# Patient Record
Sex: Female | Born: 1945 | Race: Black or African American | Hispanic: No | Marital: Married
Health system: Southern US, Community
[De-identification: ages and names within clinical notes are randomized; demographics above are authoritative.]

## PROBLEM LIST (undated history)

## (undated) DIAGNOSIS — E119 Type 2 diabetes mellitus without complications: Secondary | ICD-10-CM

## (undated) DIAGNOSIS — Z860101 Personal history of adenomatous and serrated colon polyps: Secondary | ICD-10-CM

## (undated) DIAGNOSIS — E7800 Pure hypercholesterolemia, unspecified: Secondary | ICD-10-CM

## (undated) DIAGNOSIS — K641 Second degree hemorrhoids: Secondary | ICD-10-CM

## (undated) DIAGNOSIS — K219 Gastro-esophageal reflux disease without esophagitis: Secondary | ICD-10-CM

## (undated) DIAGNOSIS — I10 Essential (primary) hypertension: Secondary | ICD-10-CM

## (undated) DIAGNOSIS — Z9109 Other allergy status, other than to drugs and biological substances: Secondary | ICD-10-CM

## (undated) HISTORY — PX: COLONOSCOPY: SHX174

## (undated) HISTORY — PX: TUBAL LIGATION: SHX77

---

## 2004-11-27 ENCOUNTER — Ambulatory Visit: Payer: Self-pay

## 2005-07-15 ENCOUNTER — Emergency Department: Payer: Self-pay | Admitting: Emergency Medicine

## 2005-07-15 ENCOUNTER — Other Ambulatory Visit: Payer: Self-pay

## 2006-04-25 ENCOUNTER — Ambulatory Visit: Payer: Self-pay

## 2007-05-21 ENCOUNTER — Ambulatory Visit: Payer: Self-pay

## 2008-08-31 ENCOUNTER — Ambulatory Visit: Payer: Self-pay | Admitting: Unknown Physician Specialty

## 2008-09-25 ENCOUNTER — Observation Stay: Payer: Self-pay | Admitting: Internal Medicine

## 2010-08-21 ENCOUNTER — Ambulatory Visit: Payer: Self-pay

## 2013-05-12 ENCOUNTER — Ambulatory Visit: Payer: Self-pay | Admitting: Internal Medicine

## 2014-07-14 ENCOUNTER — Ambulatory Visit: Payer: Self-pay | Admitting: Internal Medicine

## 2017-06-20 ENCOUNTER — Other Ambulatory Visit: Payer: Self-pay | Admitting: Internal Medicine

## 2017-06-20 DIAGNOSIS — Z1231 Encounter for screening mammogram for malignant neoplasm of breast: Secondary | ICD-10-CM

## 2017-07-16 ENCOUNTER — Ambulatory Visit
Admission: RE | Admit: 2017-07-16 | Discharge: 2017-07-16 | Disposition: A | Discharge status: Home / Self Care | Payer: Medicare HMO | Source: Ambulatory Visit | Attending: Internal Medicine | Admitting: Internal Medicine

## 2017-07-16 DIAGNOSIS — Z1231 Encounter for screening mammogram for malignant neoplasm of breast: Secondary | ICD-10-CM | POA: Insufficient documentation

## 2019-09-02 ENCOUNTER — Other Ambulatory Visit: Payer: Self-pay | Admitting: Internal Medicine

## 2019-09-02 DIAGNOSIS — Z1231 Encounter for screening mammogram for malignant neoplasm of breast: Secondary | ICD-10-CM

## 2019-10-07 ENCOUNTER — Ambulatory Visit
Admission: RE | Admit: 2019-10-07 | Discharge: 2019-10-07 | Disposition: A | Discharge status: Home / Self Care | Payer: Medicare HMO | Source: Ambulatory Visit | Attending: Internal Medicine | Admitting: Internal Medicine

## 2019-10-07 DIAGNOSIS — Z1231 Encounter for screening mammogram for malignant neoplasm of breast: Secondary | ICD-10-CM | POA: Diagnosis present

## 2020-12-11 ENCOUNTER — Other Ambulatory Visit: Payer: Self-pay | Admitting: Internal Medicine

## 2020-12-11 DIAGNOSIS — Z1231 Encounter for screening mammogram for malignant neoplasm of breast: Secondary | ICD-10-CM

## 2021-01-05 ENCOUNTER — Other Ambulatory Visit: Payer: Self-pay

## 2021-01-05 ENCOUNTER — Ambulatory Visit
Admission: RE | Admit: 2021-01-05 | Discharge: 2021-01-05 | Disposition: A | Discharge status: Home / Self Care | Payer: Medicare HMO | Source: Ambulatory Visit | Attending: Internal Medicine | Admitting: Internal Medicine

## 2021-01-05 DIAGNOSIS — Z1231 Encounter for screening mammogram for malignant neoplasm of breast: Secondary | ICD-10-CM | POA: Diagnosis present

## 2022-01-18 ENCOUNTER — Other Ambulatory Visit: Payer: Self-pay | Admitting: Internal Medicine

## 2022-01-18 DIAGNOSIS — Z1231 Encounter for screening mammogram for malignant neoplasm of breast: Secondary | ICD-10-CM

## 2022-01-29 ENCOUNTER — Emergency Department
Admission: EM | Admit: 2022-01-29 | Discharge: 2022-01-30 | Disposition: A | Discharge status: Home / Self Care | Payer: Medicare HMO | Attending: Emergency Medicine | Admitting: Emergency Medicine

## 2022-01-29 ENCOUNTER — Other Ambulatory Visit: Payer: Self-pay

## 2022-01-29 ENCOUNTER — Emergency Department: Payer: Medicare HMO

## 2022-01-29 ENCOUNTER — Other Ambulatory Visit
Admission: RE | Admit: 2022-01-29 | Discharge: 2022-01-29 | Disposition: A | Discharge status: Home / Self Care | Payer: Medicare HMO | Source: Ambulatory Visit | Attending: *Deleted | Admitting: *Deleted

## 2022-01-29 DIAGNOSIS — R079 Chest pain, unspecified: Secondary | ICD-10-CM | POA: Insufficient documentation

## 2022-01-29 DIAGNOSIS — I10 Essential (primary) hypertension: Secondary | ICD-10-CM | POA: Diagnosis not present

## 2022-01-29 DIAGNOSIS — R0789 Other chest pain: Secondary | ICD-10-CM | POA: Diagnosis present

## 2022-01-29 DIAGNOSIS — K2100 Gastro-esophageal reflux disease with esophagitis, without bleeding: Secondary | ICD-10-CM | POA: Diagnosis not present

## 2022-01-29 DIAGNOSIS — E119 Type 2 diabetes mellitus without complications: Secondary | ICD-10-CM | POA: Insufficient documentation

## 2022-01-29 DIAGNOSIS — J019 Acute sinusitis, unspecified: Secondary | ICD-10-CM | POA: Insufficient documentation

## 2022-01-29 DIAGNOSIS — R051 Acute cough: Secondary | ICD-10-CM | POA: Diagnosis not present

## 2022-01-29 HISTORY — DX: Type 2 diabetes mellitus without complications: E11.9

## 2022-01-29 HISTORY — DX: Pure hypercholesterolemia, unspecified: E78.00

## 2022-01-29 HISTORY — DX: Essential (primary) hypertension: I10

## 2022-01-29 LAB — CBC
HCT: 40.8 % (ref 36.0–46.0)
Hemoglobin: 13.3 g/dL (ref 12.0–15.0)
MCH: 28.3 pg (ref 26.0–34.0)
MCHC: 32.6 g/dL (ref 30.0–36.0)
MCV: 86.8 fL (ref 80.0–100.0)
Platelets: 334 K/uL (ref 150–400)
RBC: 4.7 MIL/uL (ref 3.87–5.11)
RDW: 14.7 % (ref 11.5–15.5)
WBC: 6.5 K/uL (ref 4.0–10.5)
nRBC: 0 % (ref 0.0–0.2)

## 2022-01-29 LAB — TROPONIN I (HIGH SENSITIVITY)
Troponin I (High Sensitivity): 6 ng/L (ref <18)
Troponin I (High Sensitivity): 6 ng/L (ref <18)
Troponin I (High Sensitivity): 6 ng/L (ref <18)

## 2022-01-29 LAB — BASIC METABOLIC PANEL WITH GFR
Anion gap: 13 (ref 5–15)
BUN: 18 mg/dL (ref 8–23)
CO2: 28 mmol/L (ref 22–32)
Calcium: 10.4 mg/dL — ABNORMAL HIGH (ref 8.9–10.3)
Chloride: 100 mmol/L (ref 98–111)
Creatinine, Ser: 0.88 mg/dL (ref 0.44–1.00)
GFR, Estimated: >60 mL/min (ref >60)
Glucose, Bld: 94 mg/dL (ref 70–99)
Potassium: 3.9 mmol/L (ref 3.5–5.1)
Sodium: 141 mmol/L (ref 135–145)

## 2022-01-29 NOTE — ED Triage Notes (Signed)
Pt comes with c/o CP that is left sided that started last night. Pt states hx o indigestion. Pt states she went to PCP today and was brought over here. BP was elevated and changed seen in EKG.  Pt denies any SOB.

## 2022-01-29 NOTE — ED Provider Notes (Signed)
[facility] Provider Note    Event Date/Time   First MD Initiated Contact with Patient 01/29/22 2356     (approximate)   History   Chief Complaint Chest Pain   HPI  Julia Novak is a 76 y.o. female with past medical history of hypertension, hyperlipidemia, diabetes who presents to the ED complaining of chest pain.  Patient reports that she has been dealing with intermittent pressure in the center of her chest for the past 24 hours.  She states it is associated with belching and a feeling of acid in the back of her throat.  She denies any fevers, cough, or difficulty breathing.  She additionally complains of pressure in the area of her frontal and maxillary sinuses.  She states she has been dealing with significant postnasal drip but denies any purulent nasal drainage.  She has not had any fevers, states she had partial relief of symptoms with Claritin.     Physical Exam   Triage Vital Signs: ED Triage Vitals  Enc Vitals Group     BP 01/29/22 1517 (!) 170/87     Pulse Rate 01/29/22 1517 (!) 101     Resp 01/29/22 1517 17     Temp 01/29/22 1517 98 F (36.7 C)     Temp src --      SpO2 01/29/22 1517 100 %     Weight --      Height --      Head Circumference --      Peak Flow --      Pain Score 01/29/22 1518 4     Pain Loc --      Pain Edu? --      Excl. in GC? --     Most recent vital signs: Vitals:   01/29/22 1517 01/29/22 2355  BP: (!) 170/87 (!) 180/84  Pulse: (!) 101 90  Resp: 17 16  Temp: 98 F (36.7 C)   SpO2: 100% 98%    Constitutional: Alert and oriented. Eyes: Conjunctivae are normal. Head: Atraumatic. Nose: No congestion/rhinnorhea. Mouth/Throat: Mucous membranes are moist.  Cardiovascular: Normal rate, regular rhythm. Grossly normal heart sounds.  2+ radial pulses bilaterally. Respiratory: Normal respiratory effort.  No retractions. Lungs CTAB. Gastrointestinal: Soft and nontender. No distention. Musculoskeletal: No lower  extremity tenderness nor edema.  Neurologic:  Normal speech and language. No gross focal neurologic deficits are appreciated.    ED Results / Procedures / Treatments   Labs (all labs ordered are listed, but only abnormal results are displayed) Labs Reviewed  BASIC METABOLIC PANEL - Abnormal; Notable for the following components:      Result Value   Calcium 10.4 (*)    All other components within normal limits  CBC  TROPONIN I (HIGH SENSITIVITY)  TROPONIN I (HIGH SENSITIVITY)     EKG  ED ECG REPORT I, Chesley Noon, the attending physician, personally viewed and interpreted this ECG.   Date: 01/29/2022  EKG Time: 15:17  Rate: 105  Rhythm: sinus tachycardia  Axis: Normal  Intervals:none  ST&T Change: None  RADIOLOGY Chest x-ray reviewed and interpreted by me with interstitial changes noted, no focal infiltrates or effusions.  PROCEDURES:  Critical Care performed: No  Procedures   MEDICATIONS ORDERED IN ED: Medications - No data to display   IMPRESSION / MDM / ASSESSMENT AND PLAN / ED COURSE  I reviewed the triage vital signs and the nursing notes.  Patient's presentation is most consistent with acute complicated illness / injury requiring diagnostic workup.  76 y.o. female with past medical history of hypertension, hyperlipidemia Differential diagnosis includes, but is not limited to, ACS, PE, pneumonia, pneumothorax, sinusitis, GERD, musculoskeletal pain.  Patient well-appearing and in no acute distress, vital signs remarkable for elevated blood pressure but otherwise reassuring.  EKG shows no evidence of arrhythmia or ischemia and 2 sets of troponin are negative.  Given patient's atypical symptoms, low suspicion for ACS and chest discomfort seems more consistent with GERD.  Chest x-ray shows no signs of pneumonia, does show possible interstitial disease that patient was informed of and will follow up with her PCP for.  No symptoms  to suggest pneumonia at this time.  Remainder of labs are reassuring with CBC showing no anemia or leukocytosis, BMP without electrolyte abnormality or AKI.  Patient is appropriate for discharge home with PCP follow-up, counseled on symptomatic management for GERD and sinusitis.  Patient agrees with plan.      FINAL CLINICAL IMPRESSION(S) / ED DIAGNOSES   Final diagnoses:  Atypical chest pain  Acute non-recurrent sinusitis, unspecified location  Gastroesophageal reflux disease with esophagitis without hemorrhage     Rx / DC Orders   ED Discharge Orders          Ordered    guaiFENesin (MUCINEX) 600 MG 12 hr tablet  2 times daily        01/30/22 0014             Note:  This document was prepared using Dragon voice recognition software and may include unintentional dictation errors.   Chesley Noon, MD 01/30/22 [phone]

## 2022-01-30 MED ORDER — GUAIFENESIN ER 600 MG PO TB12: 600.0000 mg | ORAL_TABLET | Freq: Two times a day (BID) | ORAL | 0 refills | Status: AC

## 2022-02-19 ENCOUNTER — Ambulatory Visit
Admission: RE | Admit: 2022-02-19 | Discharge: 2022-02-19 | Disposition: A | Discharge status: Home / Self Care | Payer: Medicare HMO | Source: Ambulatory Visit | Attending: Internal Medicine | Admitting: Internal Medicine

## 2022-02-19 DIAGNOSIS — Z1231 Encounter for screening mammogram for malignant neoplasm of breast: Secondary | ICD-10-CM | POA: Insufficient documentation

## 2023-03-10 ENCOUNTER — Ambulatory Visit: Payer: Medicare HMO

## 2023-03-10 DIAGNOSIS — K640 First degree hemorrhoids: Secondary | ICD-10-CM | POA: Diagnosis not present

## 2023-03-10 DIAGNOSIS — D122 Benign neoplasm of ascending colon: Secondary | ICD-10-CM

## 2023-03-10 DIAGNOSIS — D120 Benign neoplasm of cecum: Secondary | ICD-10-CM

## 2023-03-10 DIAGNOSIS — Z86010 Personal history of colonic polyps: Secondary | ICD-10-CM

## 2023-03-10 DIAGNOSIS — Z09 Encounter for follow-up examination after completed treatment for conditions other than malignant neoplasm: Secondary | ICD-10-CM | POA: Diagnosis not present

## 2023-04-01 ENCOUNTER — Other Ambulatory Visit: Payer: Self-pay | Admitting: Internal Medicine

## 2023-04-01 DIAGNOSIS — Z1231 Encounter for screening mammogram for malignant neoplasm of breast: Secondary | ICD-10-CM

## 2023-04-30 ENCOUNTER — Ambulatory Visit
Admission: RE | Admit: 2023-04-30 | Discharge: 2023-04-30 | Disposition: A | Discharge status: Home / Self Care | Payer: Medicare HMO | Source: Ambulatory Visit | Attending: Internal Medicine | Admitting: Internal Medicine

## 2023-04-30 DIAGNOSIS — Z1231 Encounter for screening mammogram for malignant neoplasm of breast: Secondary | ICD-10-CM | POA: Diagnosis present

## 2023-05-02 IMAGING — MG MM DIGITAL SCREENING BILAT W/ TOMO AND CAD
6 of 10 series · 6 of 30 positions shown · non-contrast
Comparison: Previous exam(s).

CLINICAL DATA: Screening.

EXAM:
DIGITAL SCREENING BILATERAL MAMMOGRAM WITH TOMOSYNTHESIS AND CAD
TECHNIQUE: Bilateral screening digital craniocaudal and mediolateral oblique
mammograms were obtained. Bilateral screening digital breast
tomosynthesis was performed. The images were evaluated with
computer-aided detection.

[R MLO synth-2D (1 of 2)]
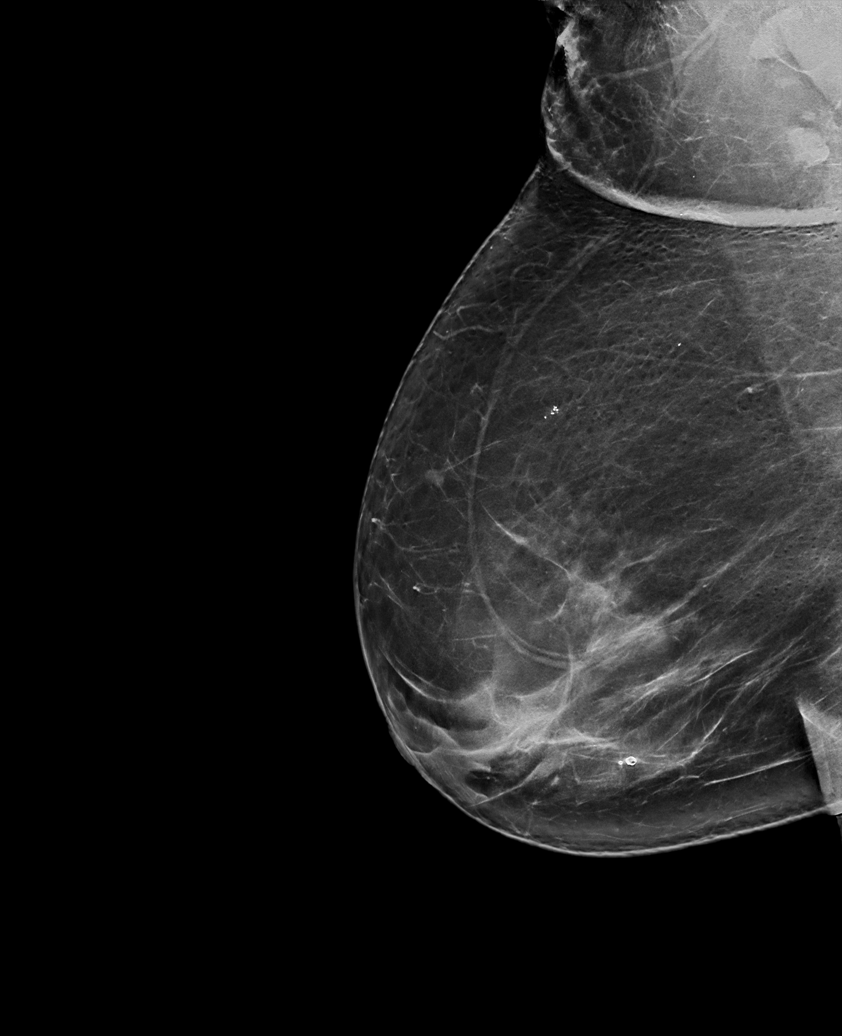

[L CC synth-2D]
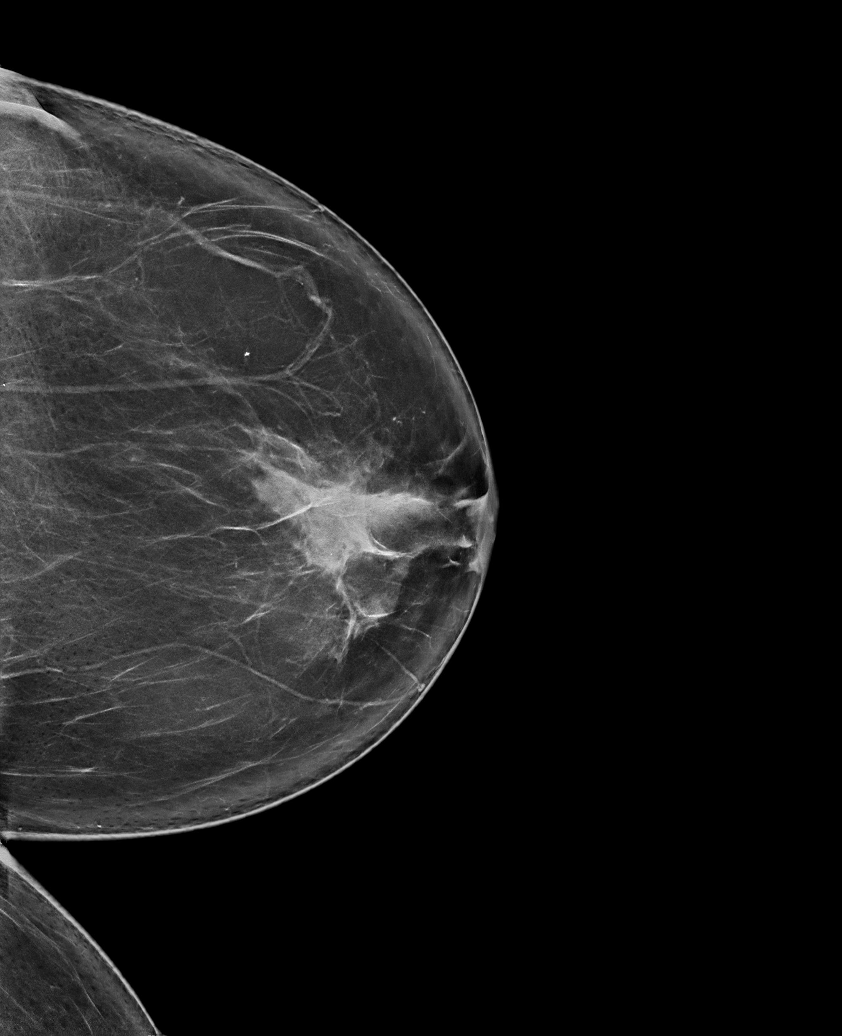

[R MLO synth-2D (2 of 2)]
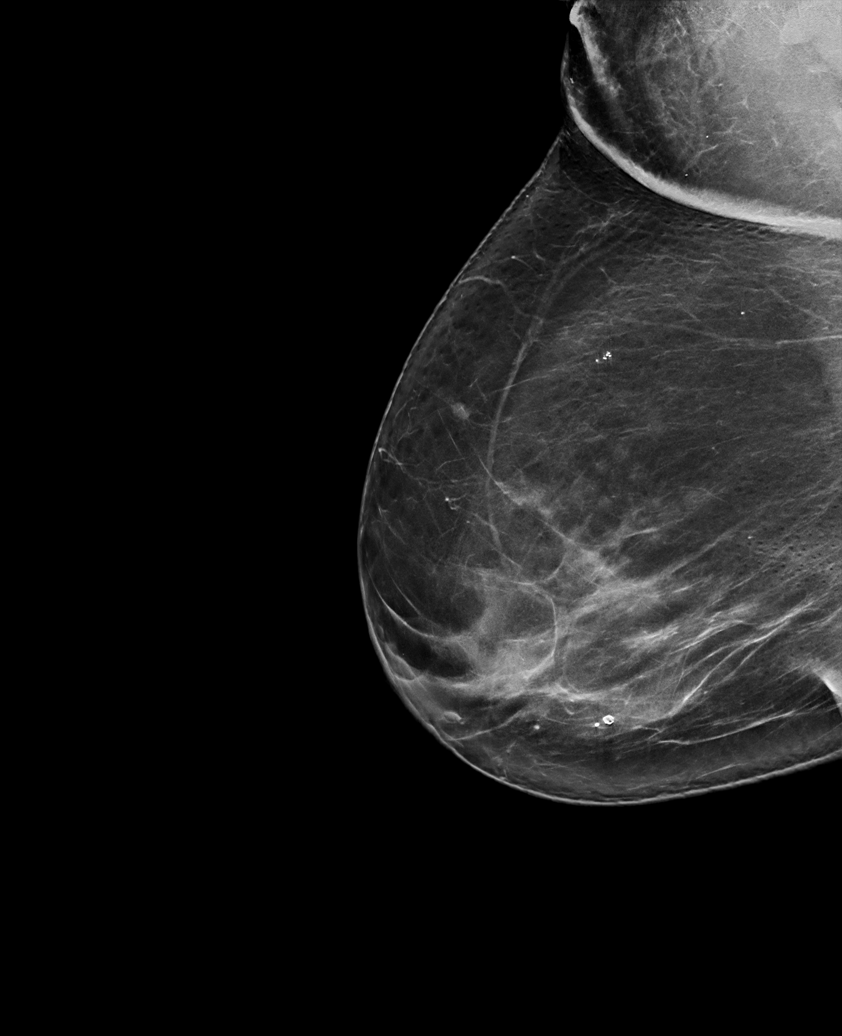

[L MLO synth-2D]
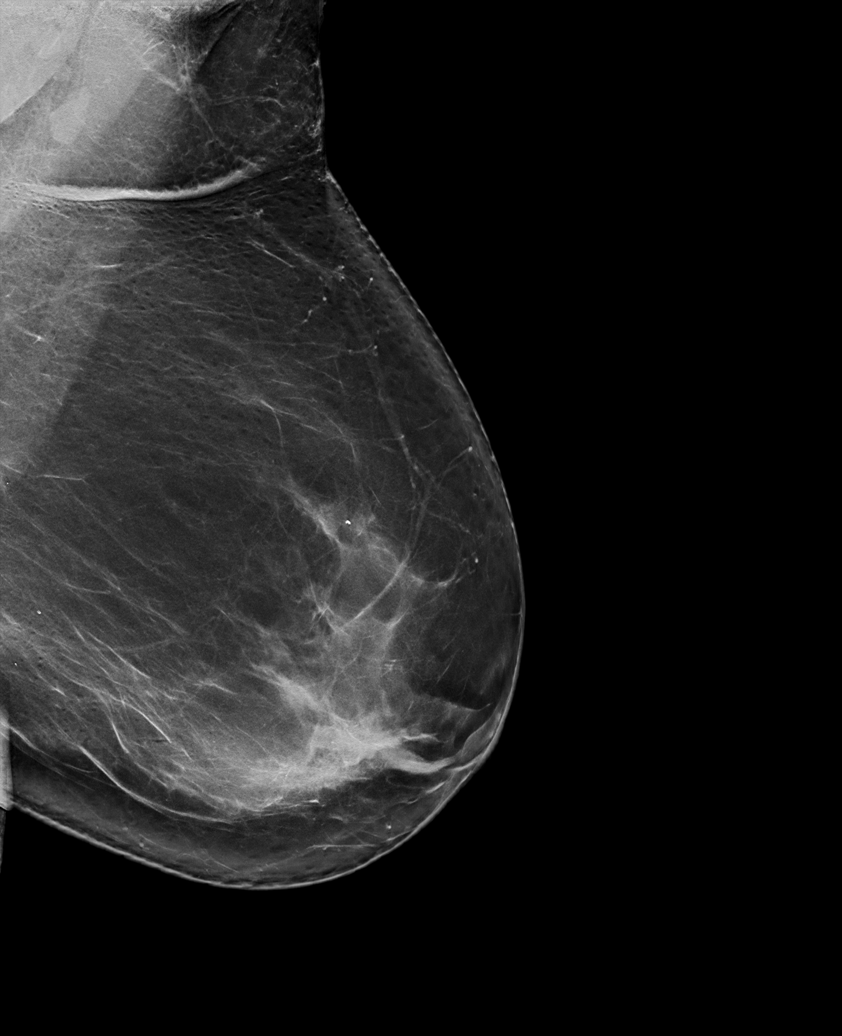

[R CC synth-2D]
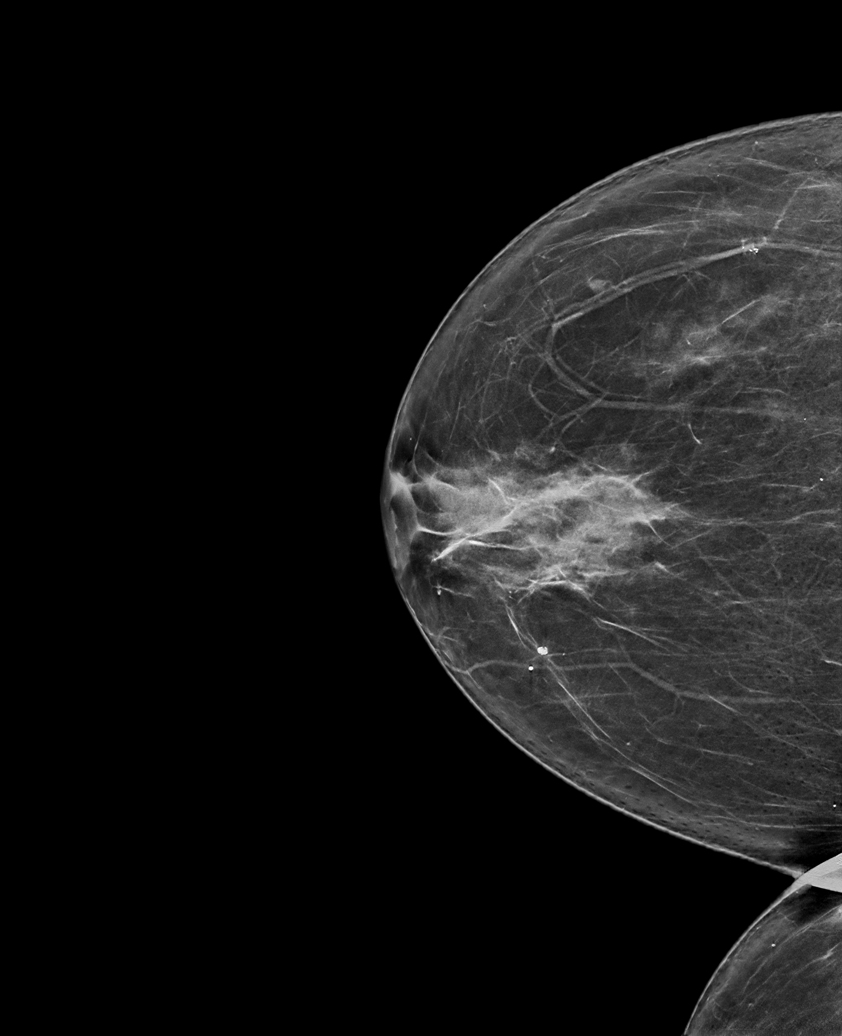

[R MLO tomo · tomo slice 45/88.0]
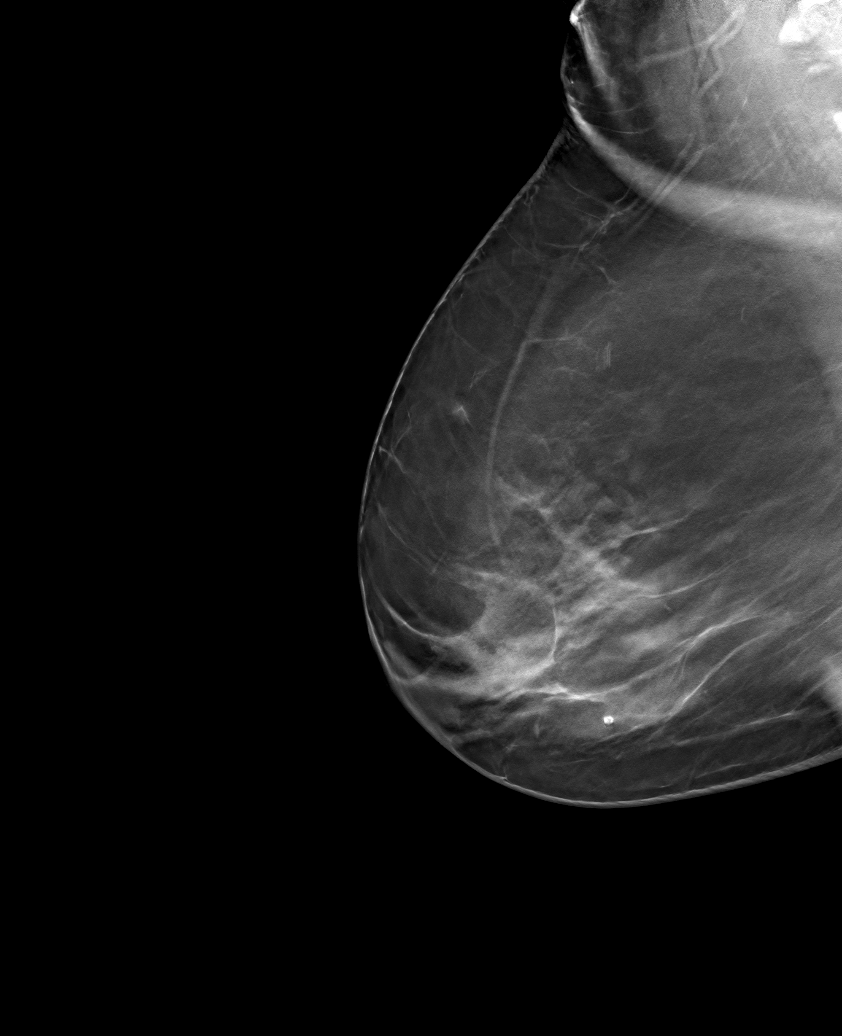

[6 of 30 positions shown; findings below may reference images not displayed]

ACR Breast Density Category c: The breast tissue is heterogeneously
dense, which may obscure small masses.
FINDINGS: There are no findings suspicious for malignancy.
IMPRESSION: No mammographic evidence of malignancy. A result letter of this
screening mammogram will be mailed directly to the patient.

RECOMMENDATION:
Screening mammogram in one year. (Code:Q3-W-BC3)

BI-RADS CATEGORY  1: Negative.

## 2024-04-01 ENCOUNTER — Other Ambulatory Visit: Payer: Self-pay | Admitting: Internal Medicine

## 2024-04-01 DIAGNOSIS — Z1231 Encounter for screening mammogram for malignant neoplasm of breast: Secondary | ICD-10-CM

## 2024-05-06 ENCOUNTER — Ambulatory Visit
Admission: RE | Admit: 2024-05-06 | Discharge: 2024-05-06 | Disposition: A | Discharge status: Home / Self Care | Source: Ambulatory Visit | Attending: Internal Medicine | Admitting: Internal Medicine

## 2024-05-06 DIAGNOSIS — Z1231 Encounter for screening mammogram for malignant neoplasm of breast: Secondary | ICD-10-CM | POA: Insufficient documentation

## 2024-06-16 ENCOUNTER — Encounter: Payer: Self-pay | Admitting: Internal Medicine

## 2024-06-29 ENCOUNTER — Ambulatory Visit
Admission: RE | Admit: 2024-06-29 | Discharge: 2024-06-29 | Disposition: A | Discharge status: Home / Self Care | Attending: Internal Medicine | Admitting: Internal Medicine

## 2024-06-29 ENCOUNTER — Ambulatory Visit: Admitting: Anesthesiology

## 2024-06-29 ENCOUNTER — Encounter
Admission: RE | Disposition: A | Discharge status: Home / Self Care | Payer: Self-pay | Source: Home / Self Care | Attending: Internal Medicine

## 2024-06-29 ENCOUNTER — Encounter: Payer: Self-pay | Admitting: Internal Medicine

## 2024-06-29 ENCOUNTER — Other Ambulatory Visit: Payer: Self-pay

## 2024-06-29 DIAGNOSIS — I10 Essential (primary) hypertension: Secondary | ICD-10-CM | POA: Insufficient documentation

## 2024-06-29 DIAGNOSIS — Z539 Procedure and treatment not carried out, unspecified reason: Secondary | ICD-10-CM | POA: Insufficient documentation

## 2024-06-29 DIAGNOSIS — K219 Gastro-esophageal reflux disease without esophagitis: Secondary | ICD-10-CM | POA: Insufficient documentation

## 2024-06-29 DIAGNOSIS — Z1211 Encounter for screening for malignant neoplasm of colon: Secondary | ICD-10-CM | POA: Insufficient documentation

## 2024-06-29 DIAGNOSIS — I4891 Unspecified atrial fibrillation: Secondary | ICD-10-CM | POA: Diagnosis not present

## 2024-06-29 DIAGNOSIS — E119 Type 2 diabetes mellitus without complications: Secondary | ICD-10-CM | POA: Diagnosis not present

## 2024-06-29 DIAGNOSIS — Z800 Family history of malignant neoplasm of digestive organs: Secondary | ICD-10-CM | POA: Insufficient documentation

## 2024-06-29 DIAGNOSIS — Z860101 Personal history of adenomatous and serrated colon polyps: Secondary | ICD-10-CM | POA: Insufficient documentation

## 2024-06-29 HISTORY — DX: Other allergy status, other than to drugs and biological substances: Z91.09

## 2024-06-29 HISTORY — DX: Second degree hemorrhoids: K64.1

## 2024-06-29 HISTORY — DX: Personal history of adenomatous and serrated colon polyps: Z86.0101

## 2024-06-29 HISTORY — PX: COLONOSCOPY: SHX5424

## 2024-06-29 HISTORY — DX: Gastro-esophageal reflux disease without esophagitis: K21.9

## 2024-06-29 LAB — GLUCOSE, CAPILLARY
Glucose-Capillary: 103 mg/dL — ABNORMAL HIGH (ref 70–99)
Glucose-Capillary: 66 mg/dL — ABNORMAL LOW (ref 70–99)

## 2024-06-29 SURGERY — COLONOSCOPY
Anesthesia: General

## 2024-06-29 MED ORDER — SODIUM CHLORIDE 0.9 % IV SOLN: INTRAVENOUS | Status: DC

## 2024-06-29 MED ORDER — DEXTROSE 50 % IV SOLN
25.0000 mL | INTRAVENOUS | Status: AC
  Administered 2024-06-29: 25 mL via INTRAVENOUS

## 2024-06-29 MED ORDER — PROPOFOL 1000 MG/100ML IV EMUL
INTRAVENOUS | Status: AC
  Filled 2024-06-29: qty 100

## 2024-06-29 MED ORDER — DEXTROSE 50 % IV SOLN
INTRAVENOUS | Status: AC
  Filled 2024-06-29: qty 50

## 2024-06-29 NOTE — H&P (Signed)
 Outpatient short stay form Pre-procedure 06/29/2024 9:43 AM Julia Novak K. Novak, M.D.  Primary Physician: Julia Novak, M.D.  Reason for visit:  Hx adenomatous colon polyps  History of present illness:  Julia Novak presents today for an encounter to set up surveillance colonoscopy for personal history of large carpet like adenomatous polyp removed in the ascending colon on March 19, 2023. Due to the large nature of the polyp it was advised patient have repeat colonoscopy in 1 year to reassess the area.  Julia Novak is a 78 year old female who presents for a follow-up colonoscopy after a large polyp was removed last year.  She underwent a colonoscopy in July 2024, during which a large polyp was removed. It had been five years since her previous colonoscopy before that procedure.  No gastrointestinal symptoms over the past year, including no abdominal pain, changes in bowel habits, or difficulty swallowing. Indigestion occurs only with spicy foods, managed with chewing a TUMS, which takes care of the burning sensation. Bowel movements are mostly daily, sometimes every other Julia Novak, with a sense of complete evacuation.  Family history includes a sister who had colon cancer approximately ten years ago and is currently in remission after chemotherapy.  Current medications include losartan, hydrochlorothiazide (HCTZ), an 81 mg aspirin, and Rybelsus.     Current Facility-Administered Medications:    0.9 %  sodium chloride infusion, , Intravenous, Continuous, Julia Novak, Julia POUR, MD, Last Rate: 20 mL/hr at 06/29/24 0937, New Bag at 06/29/24 0937  Medications Prior to Admission  Medication Sig Dispense Refill Last Dose/Taking   aspirin EC 81 MG tablet Take 81 mg by mouth daily. Swallow whole.   Past Week   glimepiride (AMARYL) 4 MG tablet Take 4 mg by mouth daily with breakfast.   Past Week   hydrochlorothiazide (HYDRODIURIL) 25 MG tablet Take 25 mg by mouth daily.   06/28/2024   losartan (COZAAR) 100 MG  tablet Take 100 mg by mouth daily.   06/29/2024 Morning   pravastatin (PRAVACHOL) 20 MG tablet Take 20 mg by mouth daily.   06/29/2024 Morning   Semaglutide (RYBELSUS) 14 MG TABS Take 14 mg by mouth daily.   Past Week     Allergies  Allergen Reactions   Hydrochlorothiazide-Triamterene Other (See Comments)    UNKNOWN   Sulfa Antibiotics Other (See Comments)    UNKNOWN   Verapamil Other (See Comments)    UNKNOWN     Past Medical History:  Diagnosis Date   Diabetes mellitus without complication (HCC)    Environmental allergies    GERD (gastroesophageal reflux disease)    Grade II hemorrhoids    High cholesterol    Hx of adenomatous colonic polyps    Hypertension     Review of systems:  Otherwise negative.    Physical Exam  Gen: Alert, oriented. Appears stated age.  HEENT: Julia Novak/AT. PERRLA. Lungs: CTA, no wheezes. CV: RR nl S1, S2. Abd: soft, benign, no masses. BS+ Ext: No edema. Pulses 2+    Planned procedures: Proceed with colonoscopy. The patient understands the nature of the planned procedure, indications, risks, alternatives and potential complications including but not limited to bleeding, infection, perforation, damage to internal organs and possible oversedation/side effects from anesthesia. The patient agrees and gives consent to proceed.  Please refer to procedure notes for findings, recommendations and patient disposition/instructions.     Julia Novak, M.D. Gastroenterology 06/29/2024  9:43 AM    \

## 2024-06-29 NOTE — Interval H&P Note (Signed)
 History and Physical Interval Note:  06/29/2024 9:44 AM  Julia Novak  has presented today for surgery, with the diagnosis of Hx of adenomatous colonic polyps (Z86.0101).  The various methods of treatment have been discussed with the patient and family. After consideration of risks, benefits and other options for treatment, the patient has consented to  Procedure(s) with comments: COLONOSCOPY (N/A) - DM as a surgical intervention.  The patient's history has been reviewed, patient examined, no change in status, stable for surgery.  I have reviewed the patient's chart and labs.  Questions were answered to the patient's satisfaction.     [place], Sofie Schendel

## 2024-06-29 NOTE — Consult Note (Signed)
 [facility] CLINIC CARDIOLOGY CONSULT NOTE       Patient ID: Julia Novak MRN: [id number] DOB/AGE: Sep 17, 1945 78 y.o.  Admit date: 06/29/2024 Referring Physician Dr. Prentice Murphy Primary Physician [place], Reyes BIRCH, MD  Primary Cardiologist None (Saw Dr. Hester 2018) Reason for Consultation new onset atrial fibrillation  HPI: Julia Novak is a 78 y.o. female  with a past medical history of hypertension, hyperlipidemia, GERD, type 2 diabetes who presented for outpatient colonoscopy on 06/29/2024.  During preprocedure evaluation there was concern that she was in atrial fibrillation, she does not have any prior history of this.  Cardiology was consulted for further evaluation.   Patient presented for outpatient colonoscopy today, on tele there was concern for atrial fibrillation. Recent outpatient lab work from 04/2024 notable for creatinine 0.9, potassium 3.8, hemoglobin 12.8, WBC 6.4, TSH normal at 1.8. EKG today NSR, nonspecific intraventricular block, rate 99 bpm. Telemetry sinus rhythm 90s. No evidence of atrial fibrillation.   At the time of evaluation this morning, patient is resting in pre-procedure area with husband at bedside. States she is feeling well overall with no complaints. Denies any issues recently. She denies CP, SOB, palpitations, heart racing, dizziness or lightheadedness. States she is able to do all activity she wishes without limitations.  Review of systems complete and found to be negative unless listed above    Past Medical History:  Diagnosis Date   Diabetes mellitus without complication (HCC)    Environmental allergies    GERD (gastroesophageal reflux disease)    Grade II hemorrhoids    High cholesterol    Hx of adenomatous colonic polyps    Hypertension     Past Surgical History:  Procedure Laterality Date   COLONOSCOPY N/A    TUBAL LIGATION      Medications Prior to Admission  Medication Sig Dispense Refill Last Dose/Taking   aspirin EC 81 MG tablet  Take 81 mg by mouth daily. Swallow whole.   Past Week   glimepiride (AMARYL) 4 MG tablet Take 4 mg by mouth daily with breakfast.   Past Week   hydrochlorothiazide (HYDRODIURIL) 25 MG tablet Take 25 mg by mouth daily.   06/28/2024   losartan (COZAAR) 100 MG tablet Take 100 mg by mouth daily.   06/29/2024 Morning   pravastatin (PRAVACHOL) 20 MG tablet Take 20 mg by mouth daily.   06/29/2024 Morning   Semaglutide (RYBELSUS) 14 MG TABS Take 14 mg by mouth daily.   Past Week   Social History   Socioeconomic History   Marital status: Married    Spouse name: Not on file   Number of children: Not on file   Years of education: Not on file   Highest education level: Not on file  Occupational History   Not on file  Tobacco Use   Smoking status: Never   Smokeless tobacco: Never  Vaping Use   Vaping status: Never Used  Substance and Sexual Activity   Alcohol use: Never   Drug use: Never   Sexual activity: Not on file  Other Topics Concern   Not on file  Social History Narrative   Not on file   Social Drivers of Health   Financial Resource Strain: Low Risk  (03/30/2024)   Received from [facility]   Overall Financial Resource Strain (CARDIA)    Difficulty of Paying Living Expenses: Not hard at all  Food Insecurity: No Food Insecurity (03/30/2024)   Received from [facility]   Hunger  Vital Sign    Within the past 12 months, you worried that your food would run out before you got the money to buy more.: Never true    Within the past 12 months, the food you bought just didn't last and you didn't have money to get more.: Never true  Transportation Needs: No Transportation Needs (03/30/2024)   Received from [facility] - Transportation    In the past 12 months, has lack of transportation kept you from medical appointments or from getting medications?: No    Lack of Transportation (Non-Medical): No  Physical Activity: Not on  file  Stress: Not on file  Social Connections: Not on file  Intimate Partner Violence: Not on file    Family History  Problem Relation Age of Onset   Breast cancer Neg Hx      Vitals:   06/29/24 1024 06/29/24 1035 06/29/24 1046 06/29/24 1055  BP: (!) 186/84 (!) 165/69 (!) 169/72 (!) (P) 164/74  Pulse: 92 99 93 (P) 87  Resp: 19 16 14  (P) 15  Temp: (!) 96 F (35.6 C)     TempSrc: Tympanic     SpO2: 100% 100% 100% (P) 100%  Weight:      Height:        PHYSICAL EXAM General: Well appearing female, well nourished, in no acute distress. HEENT: Normocephalic and atraumatic. Neck: No JVD.  Lungs: Normal respiratory effort on room air. Clear bilaterally to auscultation. No wheezes, crackles, rhonchi.  Heart: HRRR. Normal S1 and S2 without gallops or murmurs.  Abdomen: Non-distended appearing.  Msk: Normal strength and tone for age. Extremities: Warm and well perfused. No clubbing, cyanosis. No edema.  Neuro: Alert and oriented X 3. Psych: Answers questions appropriately.   Labs: Basic Metabolic Panel: No results for input(s): NA, K, CL, CO2, GLUCOSE, BUN, CREATININE, CALCIUM, MG, PHOS in the last 72 hours. Liver Function Tests: No results for input(s): AST, ALT, ALKPHOS, BILITOT, PROT, ALBUMIN in the last 72 hours. No results for input(s): LIPASE, AMYLASE in the last 72 hours. CBC: No results for input(s): WBC, NEUTROABS, HGB, HCT, MCV, PLT in the last 72 hours. Cardiac Enzymes: No results for input(s): CKTOTAL, CKMB, CKMBINDEX, TROPONINIHS in the last 72 hours. BNP: No results for input(s): BNP in the last 72 hours. D-Dimer: No results for input(s): DDIMER in the last 72 hours. Hemoglobin A1C: No results for input(s): HGBA1C in the last 72 hours. Fasting Lipid Panel: No results for input(s): CHOL, HDL, LDLCALC, TRIG, CHOLHDL, LDLDIRECT in the last 72 hours. Thyroid Function Tests: No results for  input(s): TSH, T4TOTAL, T3FREE, THYROIDAB in the last 72 hours.  Invalid input(s): FREET3 Anemia Panel: No results for input(s): VITAMINB12, FOLATE, FERRITIN, TIBC, IRON, RETICCTPCT in the last 72 hours.   Radiology: No results found.  ECHO 04/21/2022: NORMAL LEFT VENTRICULAR SYSTOLIC FUNCTION  NORMAL RIGHT VENTRICULAR SYSTOLIC FUNCTION  MILD VALVULAR REGURGITATION (See above)  NO VALVULAR STENOSIS   TELEMETRY (personally reviewed): sinus rhythm rate 90s  EKG (personally reviewed): NSR, nonspecific intraventricular block, rate 99 bpm  Data reviewed by me 06/29/2024: last 24h vitals tele labs imaging I/O ED provider note, admission H&P  Active Problems:   * No active hospital problems. *    ASSESSMENT AND PLAN:  Julia Novak is a 78 y.o. female  with a past medical history of hypertension, hyperlipidemia, GERD, type 2 diabetes who presented for outpatient colonoscopy on 06/29/2024.  During preprocedure evaluation there was concern she  was in atrial fibrillation, she does not have any prior history of this.  Cardiology was consulted for further evaluation.   # ?Atrial fibrillation # Hypertension # Hyperlipidemia No atrial fibrillation noted on my review of EKG, printed tele strip from this AM, and current tele. Unable to review prior tele recordings. HR stable in the 90s with stable BP.  -Continue current cardiac medications. ] -Will place 7 Harmsen holter monitor for additional evaluation.  -Will schedule patient for echo outpatient.   Ok for discharge today from a cardiac perspective. Will arrange for follow up in clinic with Dr. Custovic in 3 weeks. Echo scheduled for Friday.   This patient's plan of care was discussed and created with Dr. Custovic and she is in agreement.  Signed: Danita Bloch, PA-C  06/29/2024, 11:00 AM [facility] Cardiology

## 2024-06-29 NOTE — Transfer of Care (Signed)
 Immediate Anesthesia Transfer of Care Note  Patient: Julia Novak  Procedure(s) Performed: COLONOSCOPY  Patient Location: PACU  Anesthesia Type:  Level of Consciousness: awake, alert , and oriented  Airway & Oxygen Therapy: Patient Spontanous Breathing and Patient connected to nasal cannula oxygen  Post-op Assessment: Post -op Vital signs reviewed and stable  Post vital signs: Reviewed and stable  Last Vitals:  Vitals Value Taken Time  BP    Temp    Pulse    Resp    SpO2      Last Pain:  Vitals:   06/29/24 0929  TempSrc: Temporal  PainSc: 0-No pain         Complications: No notable events documented.

## 2024-06-29 NOTE — Anesthesia Preprocedure Evaluation (Signed)
 Anesthesia Evaluation  Patient identified by MRN, date of birth, ID band Patient awake    Reviewed: Allergy & Precautions, H&P , NPO status , Patient's Chart, lab work & pertinent test results, reviewed documented beta blocker date and time   History of Anesthesia Complications Negative for: history of anesthetic complications  Airway Mallampati: III  TM Distance: >3 FB Neck ROM: full    Dental  (+) Dental Advidsory Given, Caps, Teeth Intact   Pulmonary neg pulmonary ROS   Pulmonary exam normal breath sounds clear to auscultation       Cardiovascular Exercise Tolerance: Good hypertension, (-) angina (-) Past MI and (-) Cardiac Stents Normal cardiovascular exam(-) dysrhythmias (-) Valvular Problems/Murmurs Rhythm:regular Rate:Normal     Neuro/Psych negative neurological ROS  negative psych ROS   GI/Hepatic Neg liver ROS,GERD  ,,  Endo/Other  diabetes, Well Controlled, Type 2    Renal/GU negative Renal ROS  negative genitourinary   Musculoskeletal   Abdominal   Peds  Hematology negative hematology ROS (+)   Anesthesia Other Findings Past Medical History: No date: Diabetes mellitus without complication (HCC) No date: Environmental allergies No date: GERD (gastroesophageal reflux disease) No date: Grade II hemorrhoids No date: High cholesterol No date: Hx of adenomatous colonic polyps No date: Hypertension   Reproductive/Obstetrics negative OB ROS                              Anesthesia Physical Anesthesia Plan  ASA: 2  Anesthesia Plan: General   Post-op Pain Management:    Induction: Intravenous  PONV Risk Score and Plan: 3 and Propofol infusion, TIVA and Treatment may vary due to age or medical condition  Airway Management Planned: Natural Airway and Nasal Cannula  Additional Equipment:   Intra-op Plan:   Post-operative Plan:   Informed Consent: I have reviewed the  patients History and Physical, chart, labs and discussed the procedure including the risks, benefits and alternatives for the proposed anesthesia with the patient or authorized representative who has indicated his/her understanding and acceptance.     Dental Advisory Given  Plan Discussed with: Anesthesiologist, CRNA and Surgeon  Anesthesia Plan Comments:          Anesthesia Quick Evaluation

## 2024-06-29 NOTE — Interval H&P Note (Signed)
 History and Physical Interval Note:  06/29/2024 10:06 AM  Julia Novak  has presented today for surgery, with the diagnosis of Hx of adenomatous colonic polyps (Z86.0101).  The various methods of treatment have been discussed with the patient and family. After consideration of risks, benefits and other options for treatment, the patient has consented to  Procedure(s) with comments: COLONOSCOPY (N/A) - DM as a surgical intervention.  The patient's history has been reviewed, patient examined, PATIENT NOTED TO BE IN ATRIAL FLUTTER. PROCEDURE WAS CANCELED. 12 LEAD EKG ORDERED. SEE CHART FOR PATIENT DISPOSITION.  Questions were answered to the patient's satisfaction.     [place], Meriem Lemieux

## 2024-06-30 ENCOUNTER — Encounter: Payer: Self-pay | Admitting: Internal Medicine

## 2024-06-30 NOTE — Addendum Note (Signed)
 Addendum  created 06/30/24 0548 by Dario Barter, MD   Clinical Note Signed

## 2024-06-30 NOTE — Anesthesia Postprocedure Evaluation (Addendum)
 Anesthesia Post Note  Patient: Julia Novak  Procedure(s) Performed: COLONOSCOPY  Patient location during evaluation: Endoscopy Level of consciousness: awake and alert Pain management: pain level controlled Vital Signs Assessment: post-procedure vital signs reviewed and stable Respiratory status: spontaneous breathing, nonlabored ventilation and respiratory function stable Cardiovascular status: blood pressure returned to baseline and stable Postop Assessment: no apparent nausea or vomiting Anesthetic complications: no Comments: Patient was found to be in atrial fibrillation on arrival to the endoscopy suite.  Given new onset we decided to cancel at that time and have cardiology come to evaluate.  EKG in the recovery area was found to be normal sinus rhythm.  Cardiology will evaluate as outpatient.   No notable events documented.   Last Vitals:  Vitals:   06/29/24 1106 06/29/24 1136  BP: (!) 164/75 (!) 157/72  Pulse: 84 84  Resp: 15 16  Temp:    SpO2: 100% 100%    Last Pain:  Vitals:   06/29/24 1136  TempSrc:   PainSc: 0-No pain                 Prentice Murphy

## 2024-07-01 NOTE — Addendum Note (Signed)
 Addendum  created 07/01/24 1019 by Tod Handing, CRNA   Intraprocedure Event edited
